# Patient Record
Sex: Female | Born: 1945 | Race: White | Hispanic: No | Marital: Single | Smoking: Former smoker
Health system: Southern US, Community
[De-identification: ages and names within clinical notes are randomized; demographics above are authoritative.]

## PROBLEM LIST (undated history)

## (undated) DIAGNOSIS — K5730 Diverticulosis of large intestine without perforation or abscess without bleeding: Secondary | ICD-10-CM

## (undated) DIAGNOSIS — K219 Gastro-esophageal reflux disease without esophagitis: Secondary | ICD-10-CM

## (undated) DIAGNOSIS — G4733 Obstructive sleep apnea (adult) (pediatric): Secondary | ICD-10-CM

## (undated) DIAGNOSIS — Z87898 Personal history of other specified conditions: Secondary | ICD-10-CM

## (undated) DIAGNOSIS — E739 Lactose intolerance, unspecified: Secondary | ICD-10-CM

## (undated) DIAGNOSIS — I080 Rheumatic disorders of both mitral and aortic valves: Secondary | ICD-10-CM

## (undated) DIAGNOSIS — Z0000 Encounter for general adult medical examination without abnormal findings: Secondary | ICD-10-CM

## (undated) DIAGNOSIS — N390 Urinary tract infection, site not specified: Secondary | ICD-10-CM

## (undated) DIAGNOSIS — F329 Major depressive disorder, single episode, unspecified: Secondary | ICD-10-CM

## (undated) DIAGNOSIS — M949 Disorder of cartilage, unspecified: Secondary | ICD-10-CM

## (undated) DIAGNOSIS — M25579 Pain in unspecified ankle and joints of unspecified foot: Secondary | ICD-10-CM

## (undated) DIAGNOSIS — E785 Hyperlipidemia, unspecified: Secondary | ICD-10-CM

## (undated) DIAGNOSIS — M48061 Spinal stenosis, lumbar region without neurogenic claudication: Secondary | ICD-10-CM

## (undated) DIAGNOSIS — R5381 Other malaise: Secondary | ICD-10-CM

## (undated) DIAGNOSIS — R5383 Other fatigue: Secondary | ICD-10-CM

## (undated) DIAGNOSIS — M899 Disorder of bone, unspecified: Secondary | ICD-10-CM

## (undated) DIAGNOSIS — F411 Generalized anxiety disorder: Secondary | ICD-10-CM

## (undated) DIAGNOSIS — I10 Essential (primary) hypertension: Secondary | ICD-10-CM

## (undated) DIAGNOSIS — R7302 Impaired glucose tolerance (oral): Secondary | ICD-10-CM

## (undated) DIAGNOSIS — R21 Rash and other nonspecific skin eruption: Secondary | ICD-10-CM

## (undated) DIAGNOSIS — M549 Dorsalgia, unspecified: Secondary | ICD-10-CM

## (undated) HISTORY — DX: Disorder of cartilage, unspecified: M94.9

## (undated) HISTORY — DX: Other malaise: R53.81

## (undated) HISTORY — DX: Generalized anxiety disorder: F41.1

## (undated) HISTORY — PX: BREAST REDUCTION SURGERY: SHX8

## (undated) HISTORY — DX: Hyperlipidemia, unspecified: E78.5

## (undated) HISTORY — DX: Obstructive sleep apnea (adult) (pediatric): G47.33

## (undated) HISTORY — DX: Rheumatic disorders of both mitral and aortic valves: I08.0

## (undated) HISTORY — DX: Major depressive disorder, single episode, unspecified: F32.9

## (undated) HISTORY — DX: Pain in unspecified ankle and joints of unspecified foot: M25.579

## (undated) HISTORY — PX: TONSILLECTOMY AND ADENOIDECTOMY: SUR1326

## (undated) HISTORY — DX: Essential (primary) hypertension: I10

## (undated) HISTORY — DX: Other fatigue: R53.83

## (undated) HISTORY — DX: Disorder of bone, unspecified: M89.9

## (undated) HISTORY — DX: Urinary tract infection, site not specified: N39.0

## (undated) HISTORY — DX: Encounter for general adult medical examination without abnormal findings: Z00.00

## (undated) HISTORY — DX: Diverticulosis of large intestine without perforation or abscess without bleeding: K57.30

## (undated) HISTORY — DX: Rash and other nonspecific skin eruption: R21

## (undated) HISTORY — PX: OTHER SURGICAL HISTORY: SHX169

## (undated) HISTORY — DX: Personal history of other specified conditions: Z87.898

## (undated) HISTORY — DX: Morbid (severe) obesity due to excess calories: E66.01

## (undated) HISTORY — DX: Spinal stenosis, lumbar region without neurogenic claudication: M48.061

## (undated) HISTORY — DX: Impaired glucose tolerance (oral): R73.02

## (undated) HISTORY — PX: LIPOSUCTION: SHX10

## (undated) HISTORY — DX: Lactose intolerance, unspecified: E73.9

## (undated) HISTORY — DX: Dorsalgia, unspecified: M54.9

## (undated) HISTORY — DX: Gastro-esophageal reflux disease without esophagitis: K21.9

## (undated) HISTORY — PX: ABDOMINOPLASTY: SUR9

---

## 2002-05-05 LAB — HM COLONOSCOPY

## 2003-05-22 ENCOUNTER — Other Ambulatory Visit: Admission: RE | Admit: 2003-05-22 | Discharge: 2003-05-22 | Payer: Self-pay | Admitting: Internal Medicine

## 2004-08-29 ENCOUNTER — Ambulatory Visit: Payer: Self-pay | Admitting: Internal Medicine

## 2005-01-21 ENCOUNTER — Ambulatory Visit: Payer: Self-pay | Admitting: Internal Medicine

## 2005-01-22 ENCOUNTER — Ambulatory Visit: Payer: Self-pay | Admitting: Internal Medicine

## 2005-08-21 ENCOUNTER — Ambulatory Visit: Payer: Self-pay | Admitting: Pulmonary Disease

## 2005-09-10 ENCOUNTER — Ambulatory Visit: Payer: Self-pay | Admitting: Internal Medicine

## 2005-09-29 ENCOUNTER — Ambulatory Visit: Payer: Self-pay | Admitting: Internal Medicine

## 2005-10-06 ENCOUNTER — Ambulatory Visit: Payer: Self-pay | Admitting: Internal Medicine

## 2006-03-05 ENCOUNTER — Ambulatory Visit: Payer: Self-pay | Admitting: Internal Medicine

## 2006-03-10 ENCOUNTER — Ambulatory Visit: Payer: Self-pay | Admitting: Internal Medicine

## 2006-05-29 ENCOUNTER — Ambulatory Visit: Payer: Self-pay | Admitting: Internal Medicine

## 2007-03-29 ENCOUNTER — Ambulatory Visit: Payer: Self-pay | Admitting: Internal Medicine

## 2007-03-29 LAB — CONVERTED CEMR LAB
ALT: 82 units/L — ABNORMAL HIGH (ref 0–35)
AST: 62 units/L — ABNORMAL HIGH (ref 0–37)
Albumin: 4.3 g/dL (ref 3.5–5.2)
Alkaline Phosphatase: 60 units/L (ref 39–117)
Basophils Relative: 0.7 % (ref 0.0–1.0)
Bilirubin Urine: NEGATIVE
Bilirubin, Direct: 0.1 mg/dL (ref 0.0–0.3)
Calcium: 9.6 mg/dL (ref 8.4–10.5)
Creatinine, Ser: 0.7 mg/dL (ref 0.4–1.2)
Crystals: NEGATIVE
Eosinophils Absolute: 0.2 10*3/uL (ref 0.0–0.6)
GFR calc Af Amer: 109 mL/min
Glucose, Bld: 111 mg/dL — ABNORMAL HIGH (ref 70–99)
HDL: 54.1 mg/dL (ref >39.0)
Lymphocytes Relative: 47.4 % — ABNORMAL HIGH (ref 12.0–46.0)
Neutro Abs: 2.9 10*3/uL (ref 1.4–7.7)
Neutrophils Relative %: 40.8 % — ABNORMAL LOW (ref 43.0–77.0)
Nitrite: NEGATIVE
Platelets: 251 10*3/uL (ref 150–400)
Potassium: 3.7 meq/L (ref 3.5–5.1)
Total CHOL/HDL Ratio: 5
Total Protein, Urine: NEGATIVE mg/dL
Total Protein: 7.7 g/dL (ref 6.0–8.3)
Urine Glucose: NEGATIVE mg/dL
VLDL: 43 mg/dL — ABNORMAL HIGH (ref 0–40)
pH: 6 (ref 5.0–8.0)

## 2007-03-31 ENCOUNTER — Ambulatory Visit: Payer: Self-pay | Admitting: Internal Medicine

## 2007-03-31 LAB — CONVERTED CEMR LAB
Hep B C IgM: NEGATIVE
Hepatitis B Surface Ag: NEGATIVE

## 2007-04-06 ENCOUNTER — Encounter: Admission: RE | Admit: 2007-04-06 | Discharge: 2007-04-06 | Payer: Self-pay | Admitting: Internal Medicine

## 2007-04-07 ENCOUNTER — Ambulatory Visit: Payer: Self-pay | Admitting: Pulmonary Disease

## 2007-04-14 ENCOUNTER — Ambulatory Visit: Payer: Self-pay | Admitting: Internal Medicine

## 2007-04-14 ENCOUNTER — Encounter: Payer: Self-pay | Admitting: Internal Medicine

## 2007-04-14 DIAGNOSIS — I080 Rheumatic disorders of both mitral and aortic valves: Secondary | ICD-10-CM

## 2007-04-14 DIAGNOSIS — F329 Major depressive disorder, single episode, unspecified: Secondary | ICD-10-CM

## 2007-04-14 DIAGNOSIS — I10 Essential (primary) hypertension: Secondary | ICD-10-CM

## 2007-04-14 DIAGNOSIS — E785 Hyperlipidemia, unspecified: Secondary | ICD-10-CM | POA: Insufficient documentation

## 2007-04-14 DIAGNOSIS — M25579 Pain in unspecified ankle and joints of unspecified foot: Secondary | ICD-10-CM | POA: Insufficient documentation

## 2007-04-14 DIAGNOSIS — Z87898 Personal history of other specified conditions: Secondary | ICD-10-CM

## 2007-04-14 DIAGNOSIS — F411 Generalized anxiety disorder: Secondary | ICD-10-CM

## 2007-04-14 DIAGNOSIS — F3289 Other specified depressive episodes: Secondary | ICD-10-CM

## 2007-04-14 HISTORY — DX: Major depressive disorder, single episode, unspecified: F32.9

## 2007-04-14 HISTORY — DX: Pain in unspecified ankle and joints of unspecified foot: M25.579

## 2007-04-14 HISTORY — DX: Other specified depressive episodes: F32.89

## 2007-04-14 HISTORY — DX: Morbid (severe) obesity due to excess calories: E66.01

## 2007-04-14 HISTORY — DX: Generalized anxiety disorder: F41.1

## 2007-04-14 HISTORY — DX: Essential (primary) hypertension: I10

## 2007-04-14 HISTORY — DX: Rheumatic disorders of both mitral and aortic valves: I08.0

## 2007-04-14 HISTORY — DX: Hyperlipidemia, unspecified: E78.5

## 2007-04-14 HISTORY — DX: Personal history of other specified conditions: Z87.898

## 2007-04-15 IMAGING — CT CT PARANASAL SINUSES LIMITED
1 of 2 series · 16 of 22 positions shown, 20 images · non-contrast
Comparison: none

CLINICAL DATA: Refractory sinusitis.  Bilateral ear infections and sinus infection for six weeks with pressure around eyes, sore throat, and cough.
 LIMITED CT OF PARANASAL SINUSES:
TECHNIQUE: Limited coronal CT images were obtained through the paranasal sinuses without intravenous contrast.

[Series 4: ltd sinus 3.0 h30s · axial · 0.29mm/px · z∈[-97,-13]mm · 16 of 21 slices shown, 20 images]
[im 2/21  brain]
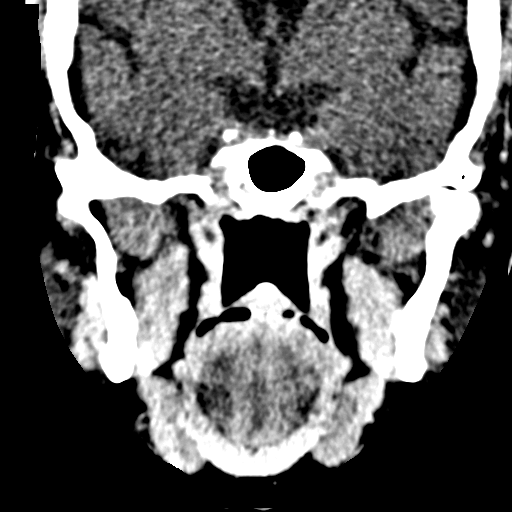
[im 2/21  bone]
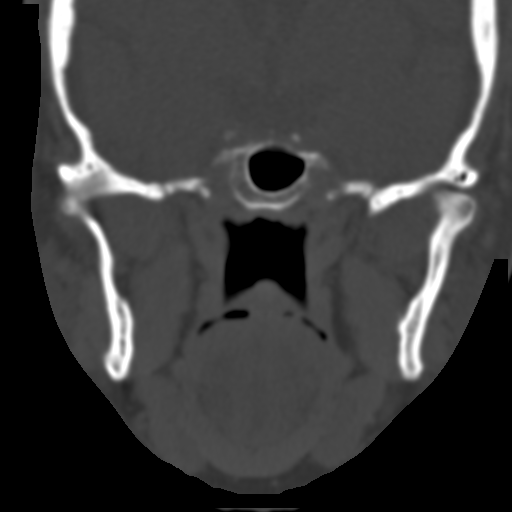
[im 3/21  bone]
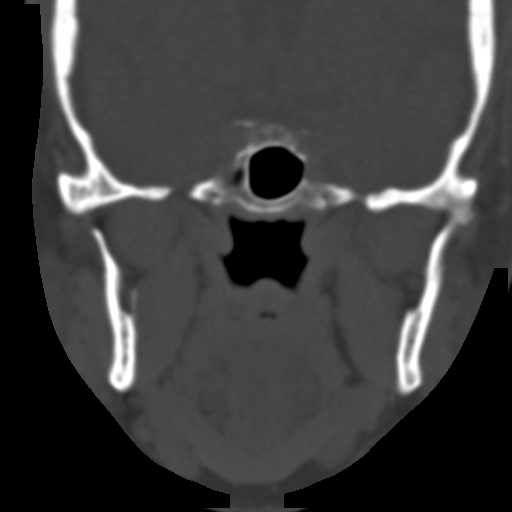
[im 4/21  bone]
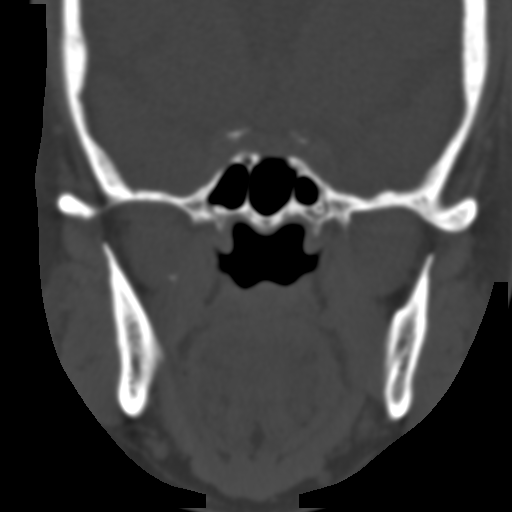
[im 6/21  bone]
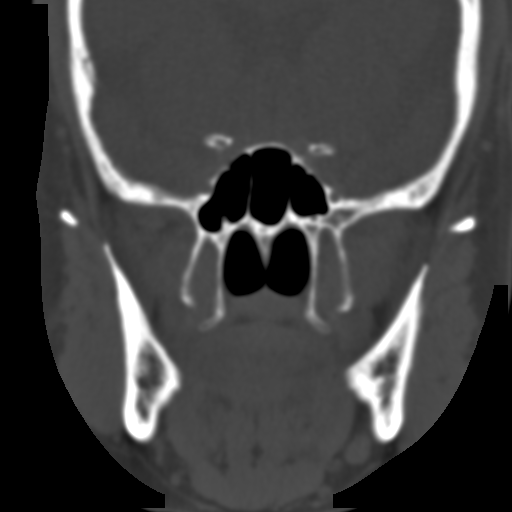
[im 7/21  brain]
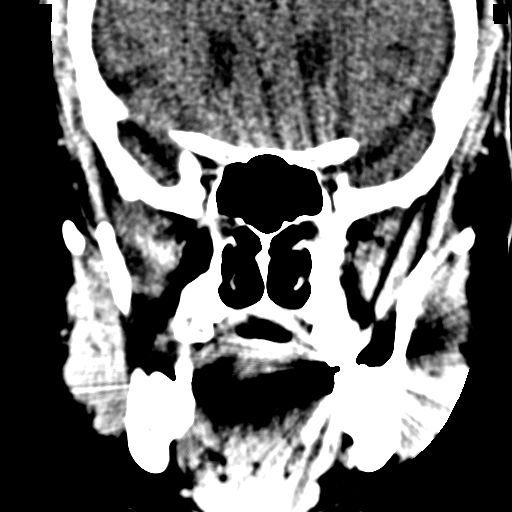
[im 7/21  bone]
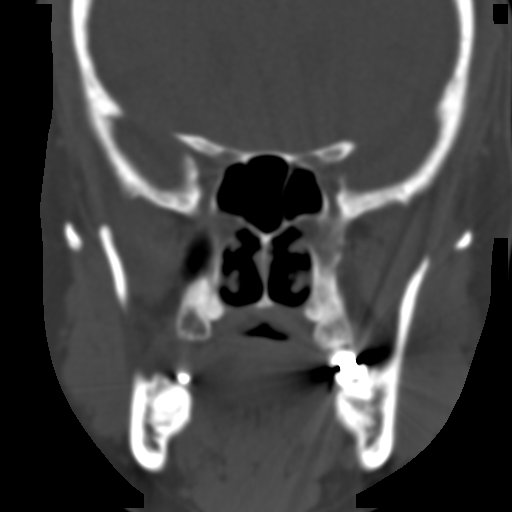
[im 8/21  bone]
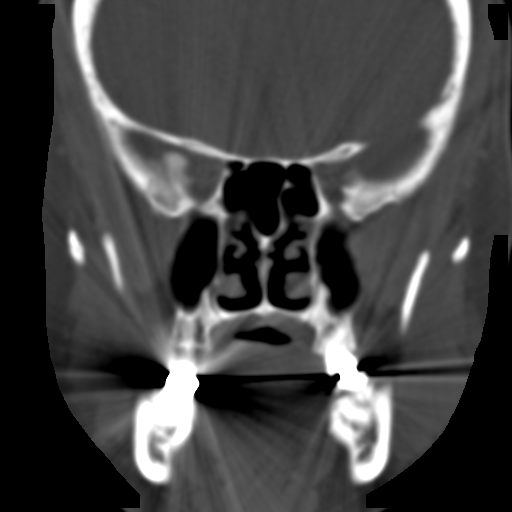
[im 9/21  bone]
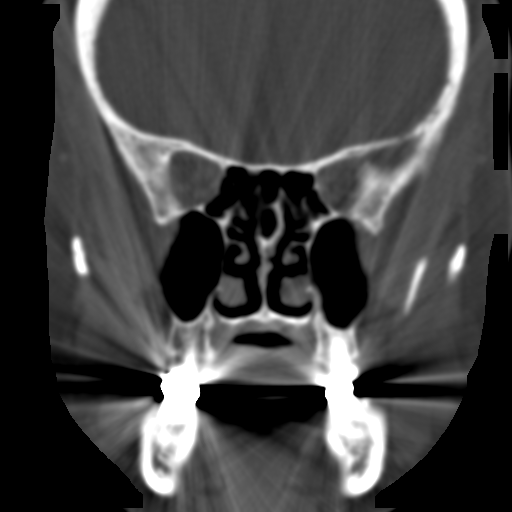
[im 10/21  bone]
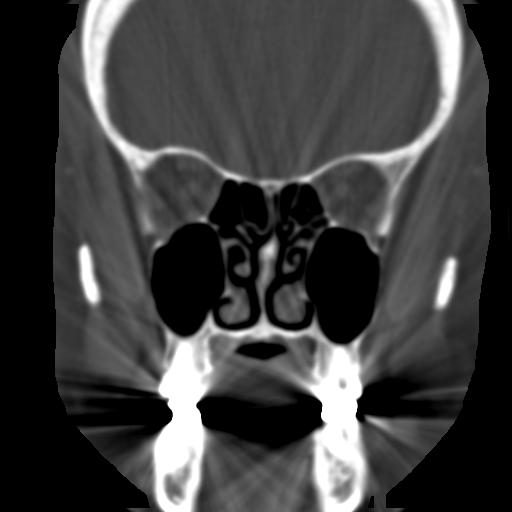
[im 12/21  brain]
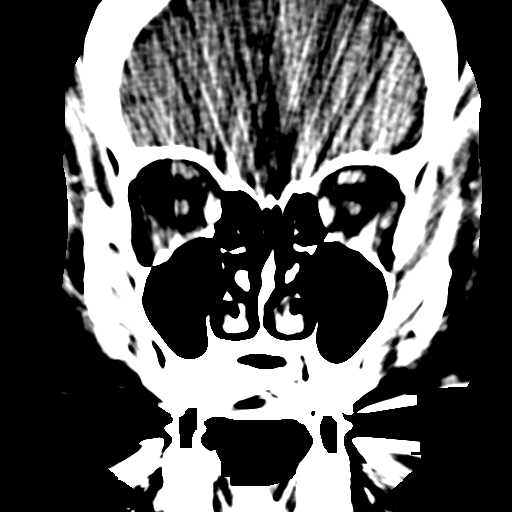
[im 12/21  bone]
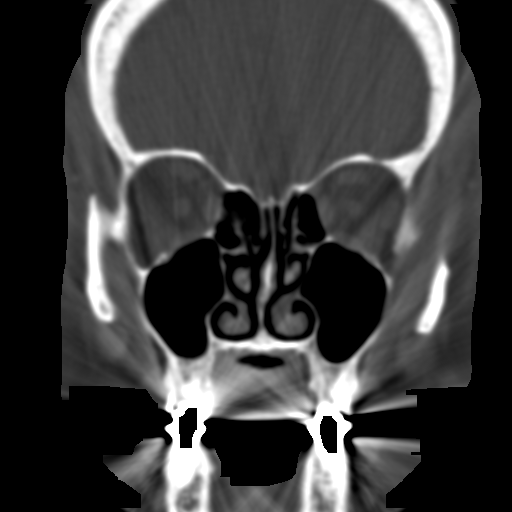
[im 13/21  bone]
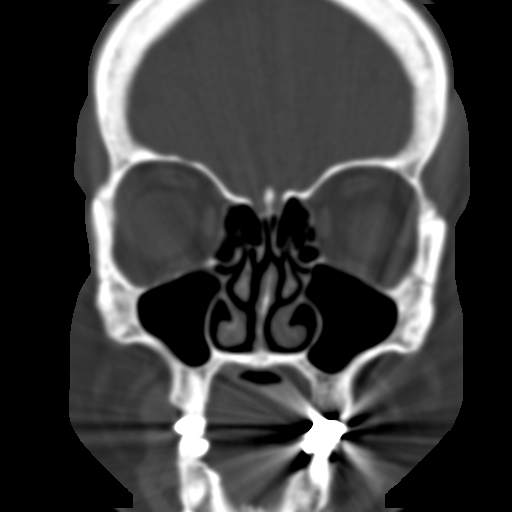
[im 14/21  bone]
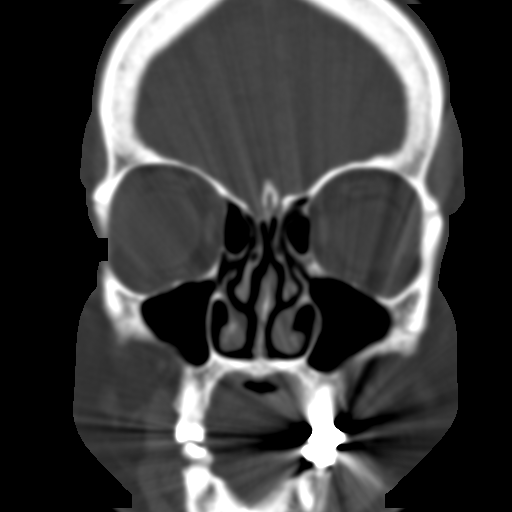
[im 15/21  bone]
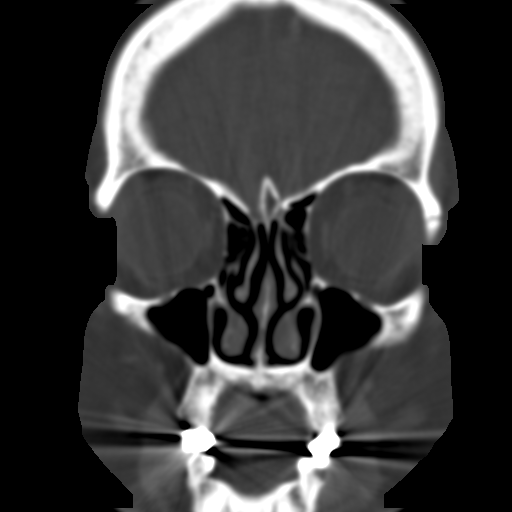
[im 16/21  brain]
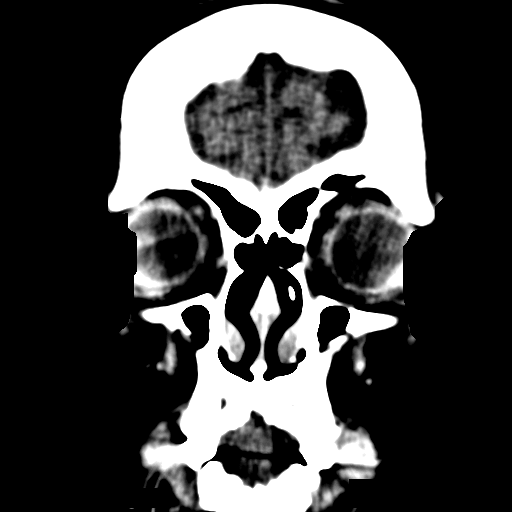
[im 16/21  bone]
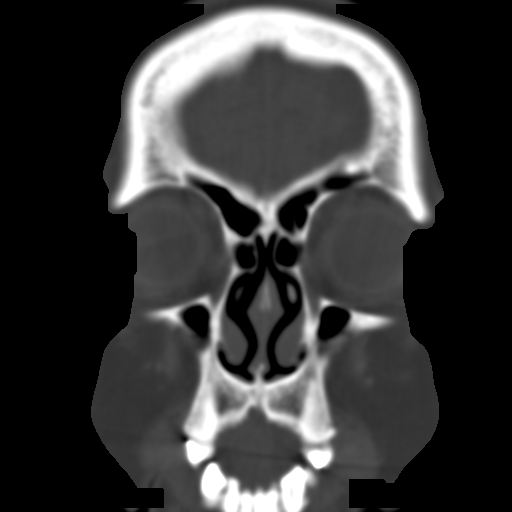
[im 18/21  bone]
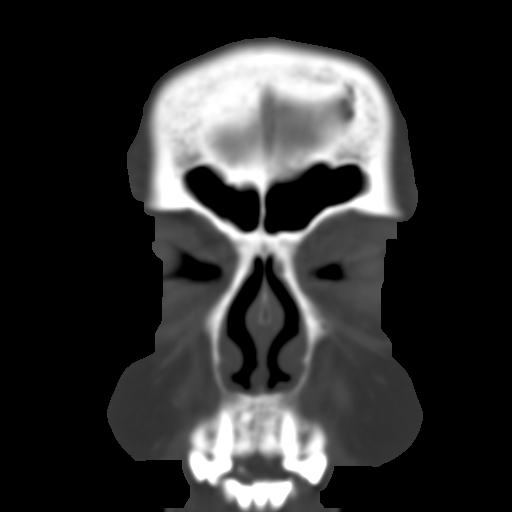
[im 19/21  bone]
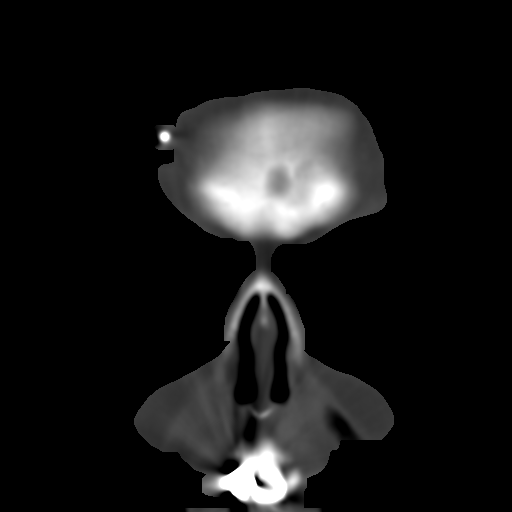
[im 20/21  bone]
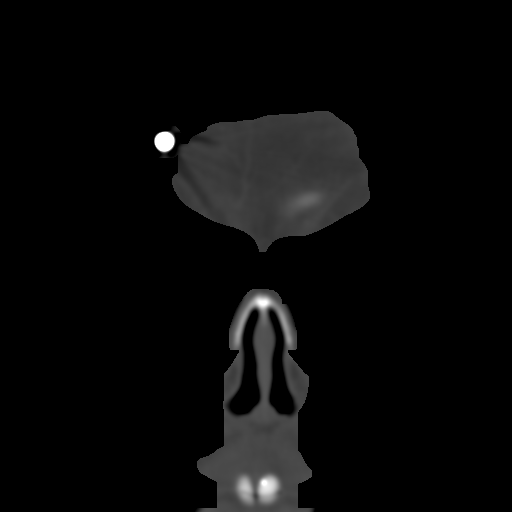

[16 of 22 positions shown; findings below may reference images not displayed]

FINDINGS: Well aerated frontal, ethmoid, sphenoid, and maxillary sinuses.  The ostiomeatal units appear patent.  No bony sinus wall abnormality.
IMPRESSION: Negative CT scan of the paranasal sinuses.

## 2007-04-21 ENCOUNTER — Ambulatory Visit (HOSPITAL_BASED_OUTPATIENT_CLINIC_OR_DEPARTMENT_OTHER): Admission: RE | Admit: 2007-04-21 | Discharge: 2007-04-21 | Payer: Self-pay | Admitting: Pulmonary Disease

## 2007-04-30 ENCOUNTER — Ambulatory Visit: Payer: Self-pay | Admitting: Pulmonary Disease

## 2007-05-31 ENCOUNTER — Ambulatory Visit: Payer: Self-pay | Admitting: Internal Medicine

## 2007-05-31 LAB — CONVERTED CEMR LAB
ALT: 72 units/L — ABNORMAL HIGH (ref 0–35)
Alkaline Phosphatase: 63 units/L (ref 39–117)
Bilirubin, Direct: 0.1 mg/dL (ref 0.0–0.3)
Triglycerides: 200 mg/dL — ABNORMAL HIGH (ref 0–149)
VLDL: 40 mg/dL (ref 0–40)

## 2007-11-09 ENCOUNTER — Ambulatory Visit: Payer: Self-pay | Admitting: Internal Medicine

## 2007-11-09 DIAGNOSIS — R21 Rash and other nonspecific skin eruption: Secondary | ICD-10-CM

## 2007-11-09 DIAGNOSIS — K219 Gastro-esophageal reflux disease without esophagitis: Secondary | ICD-10-CM

## 2007-11-09 DIAGNOSIS — G4733 Obstructive sleep apnea (adult) (pediatric): Secondary | ICD-10-CM

## 2007-11-09 DIAGNOSIS — M549 Dorsalgia, unspecified: Secondary | ICD-10-CM | POA: Insufficient documentation

## 2007-11-09 HISTORY — DX: Obstructive sleep apnea (adult) (pediatric): G47.33

## 2007-11-09 HISTORY — DX: Rash and other nonspecific skin eruption: R21

## 2007-11-09 HISTORY — DX: Gastro-esophageal reflux disease without esophagitis: K21.9

## 2007-11-09 HISTORY — DX: Dorsalgia, unspecified: M54.9

## 2008-01-05 ENCOUNTER — Telehealth: Payer: Self-pay | Admitting: Internal Medicine

## 2008-01-31 ENCOUNTER — Telehealth: Payer: Self-pay | Admitting: Internal Medicine

## 2008-03-13 ENCOUNTER — Telehealth (INDEPENDENT_AMBULATORY_CARE_PROVIDER_SITE_OTHER): Payer: Self-pay | Admitting: *Deleted

## 2008-03-20 ENCOUNTER — Ambulatory Visit: Payer: Self-pay | Admitting: Internal Medicine

## 2008-03-20 LAB — CONVERTED CEMR LAB
AST: 64 units/L — ABNORMAL HIGH (ref 0–37)
Albumin: 4.1 g/dL (ref 3.5–5.2)
Alkaline Phosphatase: 61 units/L (ref 39–117)
BUN: 17 mg/dL (ref 6–23)
Basophils Absolute: 0 10*3/uL (ref 0.0–0.1)
Bilirubin, Direct: 0.1 mg/dL (ref 0.0–0.3)
Calcium: 9.5 mg/dL (ref 8.4–10.5)
Cholesterol: 176 mg/dL (ref 0–200)
Eosinophils Absolute: 0.4 10*3/uL (ref 0.0–0.7)
GFR calc Af Amer: 109 mL/min
GFR calc non Af Amer: 90 mL/min
Hemoglobin, Urine: NEGATIVE
Hemoglobin: 13.4 g/dL (ref 12.0–15.0)
Ketones, ur: NEGATIVE mg/dL
LDL Cholesterol: 104 mg/dL — ABNORMAL HIGH (ref 0–99)
Lymphocytes Relative: 48.2 % — ABNORMAL HIGH (ref 12.0–46.0)
MCHC: 34 g/dL (ref 30.0–36.0)
MCV: 87.5 fL (ref 78.0–100.0)
Monocytes Absolute: 0.6 10*3/uL (ref 0.1–1.0)
Nitrite: NEGATIVE
Platelets: 230 10*3/uL (ref 150–400)
RBC: 4.49 M/uL (ref 3.87–5.11)
Sodium: 143 meq/L (ref 135–145)
TSH: 3.68 microintl units/mL (ref 0.35–5.50)
Total Bilirubin: 0.7 mg/dL (ref 0.3–1.2)
Total Protein: 7.5 g/dL (ref 6.0–8.3)
Triglycerides: 134 mg/dL (ref 0–149)
Urobilinogen, UA: 0.2 (ref 0.0–1.0)
VLDL: 27 mg/dL (ref 0–40)
WBC: 9.7 10*3/uL (ref 4.5–10.5)

## 2008-03-22 ENCOUNTER — Ambulatory Visit: Payer: Self-pay | Admitting: Internal Medicine

## 2008-03-22 DIAGNOSIS — K5730 Diverticulosis of large intestine without perforation or abscess without bleeding: Secondary | ICD-10-CM

## 2008-03-22 DIAGNOSIS — N390 Urinary tract infection, site not specified: Secondary | ICD-10-CM

## 2008-03-22 DIAGNOSIS — E739 Lactose intolerance, unspecified: Secondary | ICD-10-CM

## 2008-03-22 HISTORY — DX: Urinary tract infection, site not specified: N39.0

## 2008-03-22 HISTORY — DX: Diverticulosis of large intestine without perforation or abscess without bleeding: K57.30

## 2008-03-22 HISTORY — DX: Lactose intolerance, unspecified: E73.9

## 2008-04-03 ENCOUNTER — Telehealth: Payer: Self-pay | Admitting: Internal Medicine

## 2008-05-12 ENCOUNTER — Ambulatory Visit: Payer: Self-pay | Admitting: Internal Medicine

## 2008-10-11 ENCOUNTER — Telehealth (INDEPENDENT_AMBULATORY_CARE_PROVIDER_SITE_OTHER): Payer: Self-pay | Admitting: *Deleted

## 2008-10-13 IMAGING — US US ABDOMEN COMPLETE
1 series · 13 of 25 positions shown · non-contrast
Comparison: none

CLINICAL DATA: Elevated liver function tests.  
 COMPLETE ABDOMINAL ULTRASOUND:
TECHNIQUE: Complete abdominal ultrasound examination was performed including evaluation of the liver, gallbladder, bile ducts, pancreas, kidneys, spleen, IVC, and abdominal aorta.

[Series 1: us abdomen complete · 0.28mm/px · 13 of 66 slices shown]
[im 1/66]
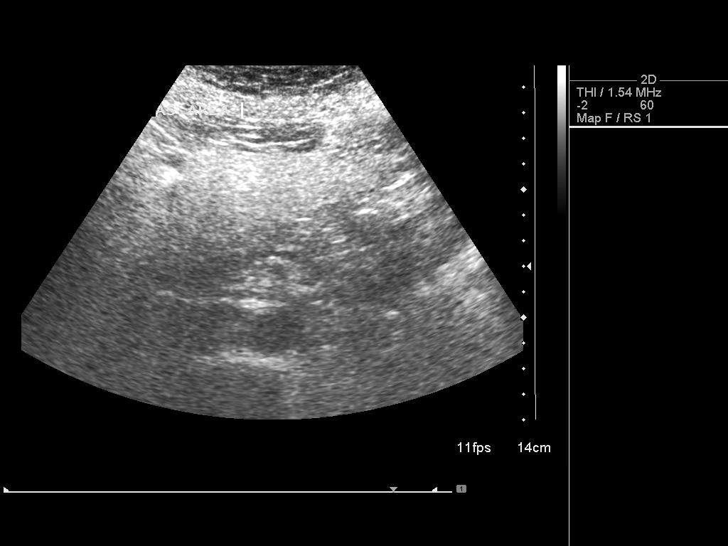
[im 6/66]
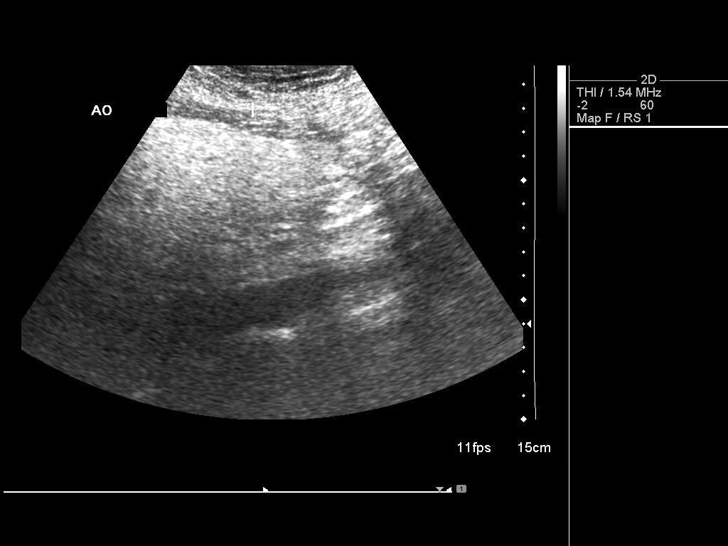
[im 11/66]
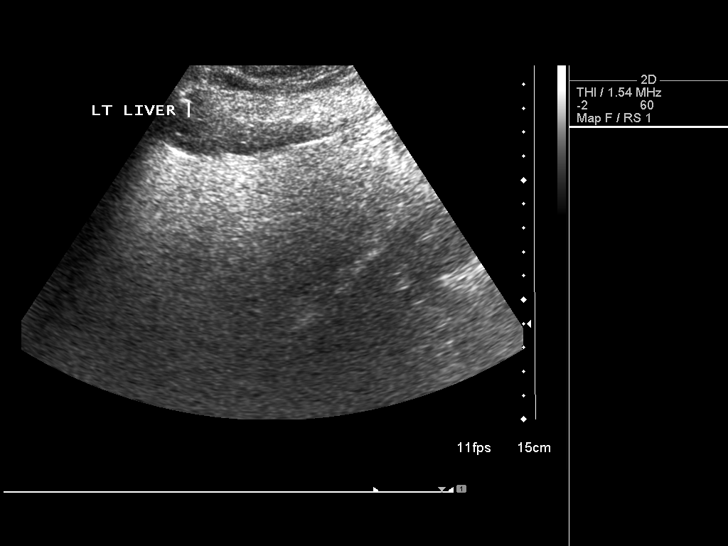
[im 17/66]
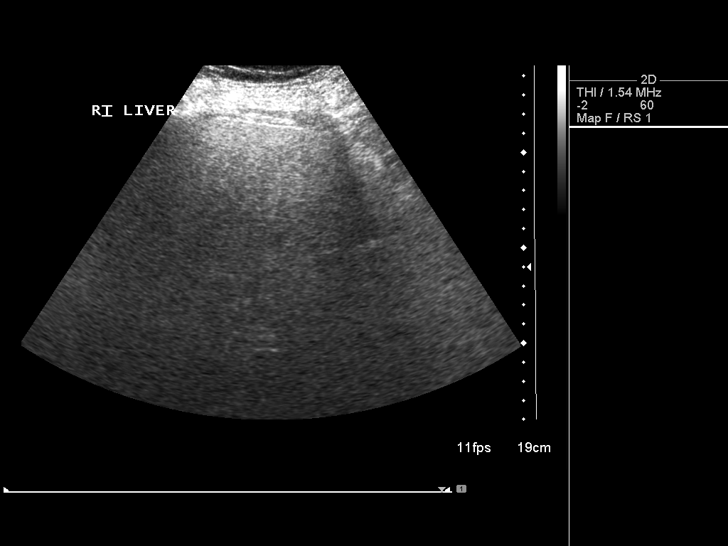
[im 22/66]
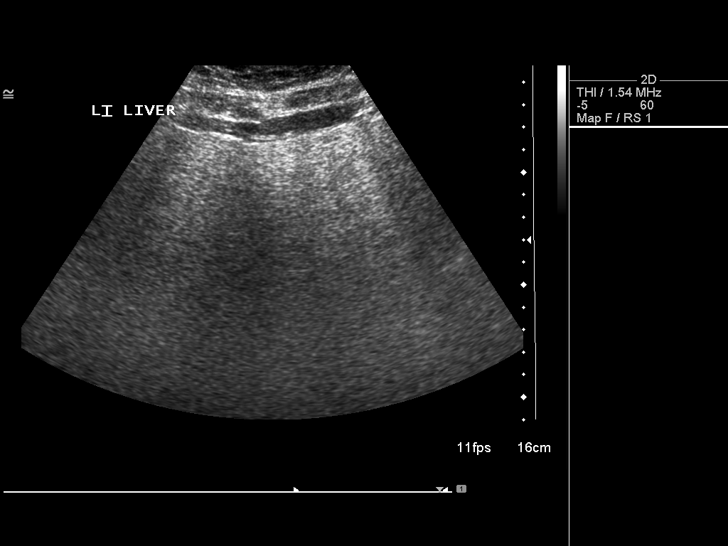
[im 28/66]
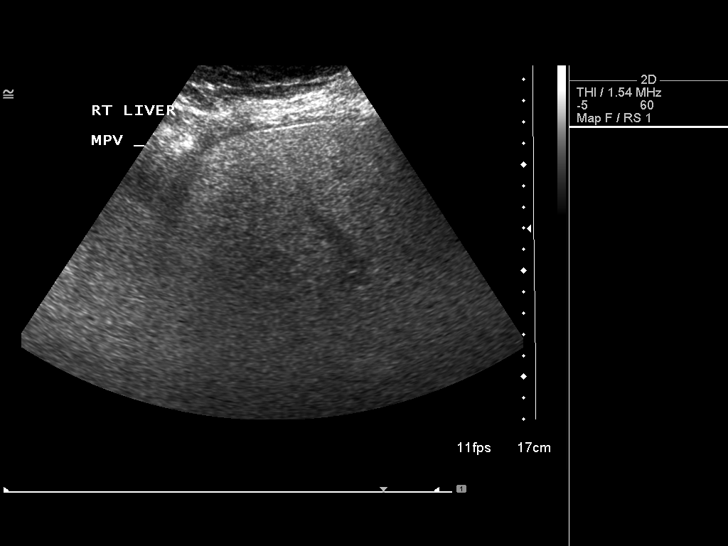
[im 33/66]
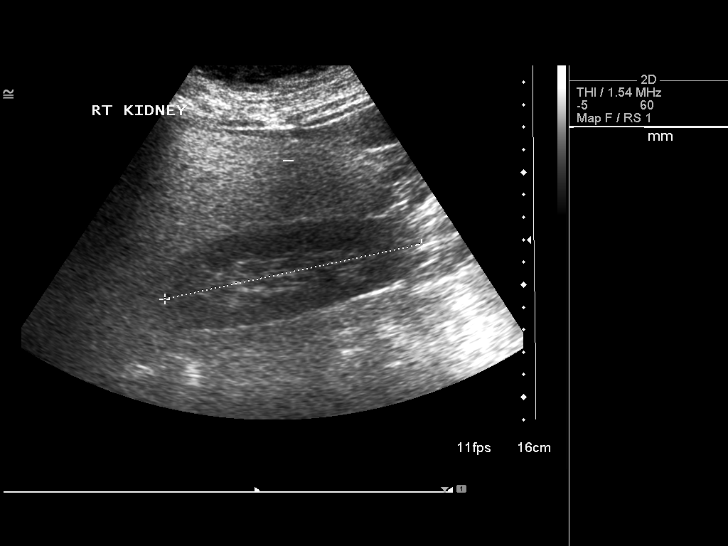
[im 38/66]
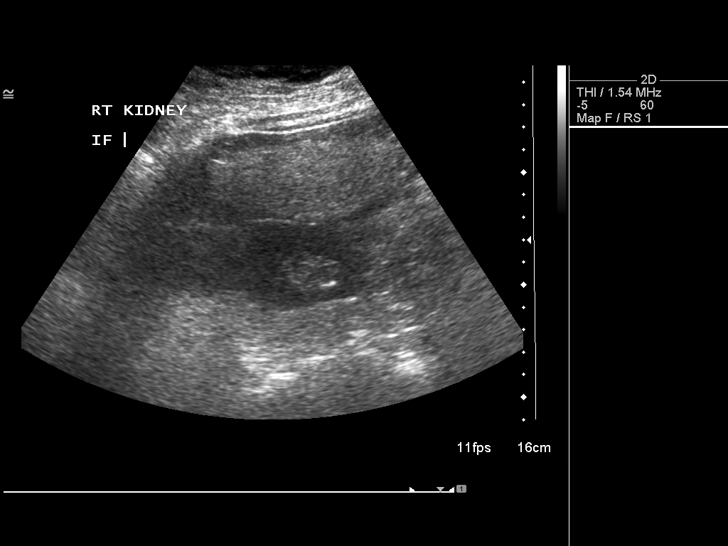
[im 44/66]
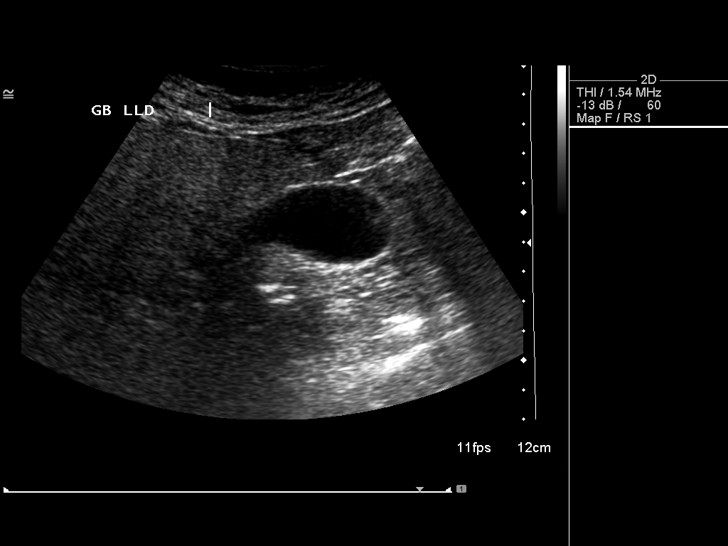
[im 49/66]
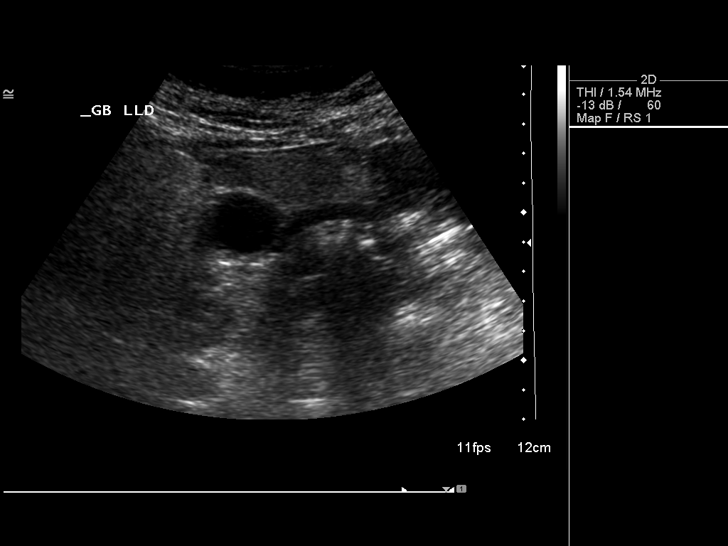
[im 55/66]
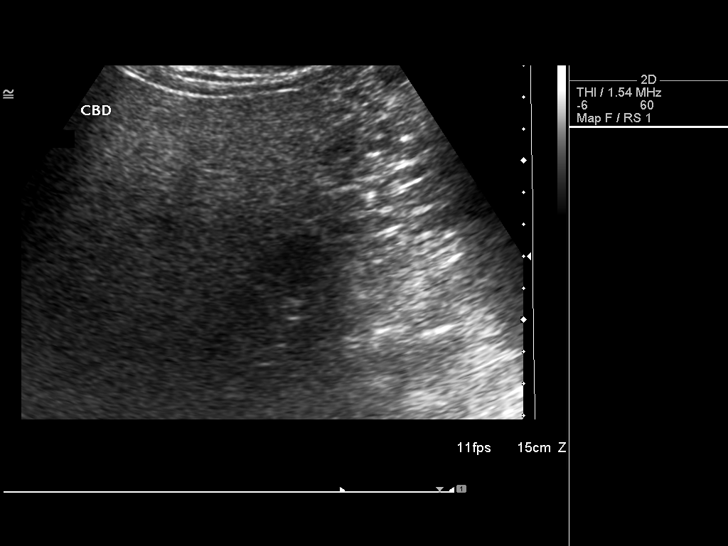
[im 60/66]
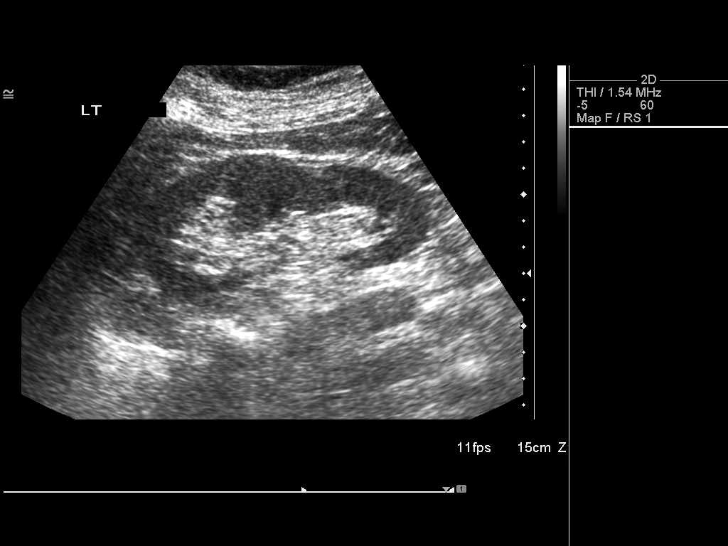
[im 66/66]
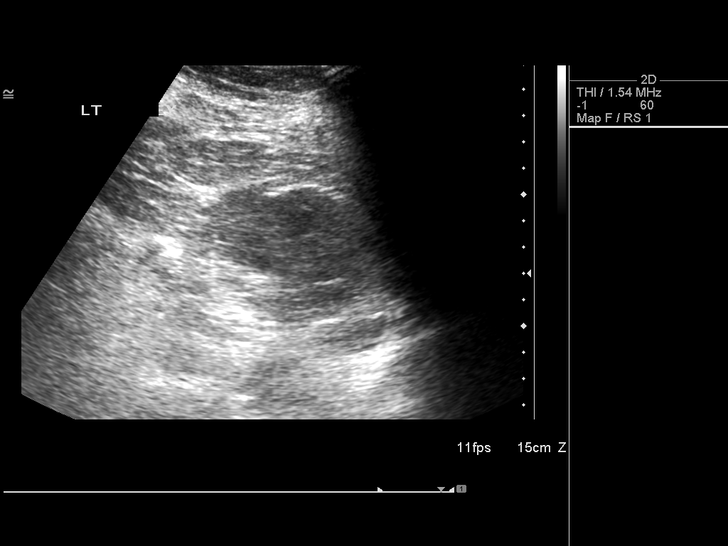

[13 of 25 positions shown; findings below may reference images not displayed]

FINDINGS: The gallbladder is well visualized and appears normal.  Wall thickness is normal.  Common bile duct is normal at 2.8mm in diameter.  The liver is slightly prominent and there is diffuse increased echogenicity consistent with fatty infiltration.  There is limited visualization of the pancreas and inferior vena cava but the visualized portions are normal.  The uncinate process and extreme tail of the pancreas are obscured by adjacent bowel.  The spleen is normal at 11.1cm in length.  Right kidney is 11.7cm in length.  Left kidney is 11.7cm in length.  No hydronephrosis or renal cortical thinning.  The maximum diameter of the visualized portion of the aorta is 2.2cm.  Some of the distal aorta is obscured by bowel gas.
IMPRESSION: 1.  No acute abnormality.  Fatty liver with slight enlargement of the liver to a length of 20.2cm.  
 2.  Slightly limited evaluation of the inferior vena cava and pancreas as described.  Normal gallbladder.

## 2008-11-08 ENCOUNTER — Telehealth (INDEPENDENT_AMBULATORY_CARE_PROVIDER_SITE_OTHER): Payer: Self-pay | Admitting: *Deleted

## 2009-03-13 ENCOUNTER — Ambulatory Visit: Payer: Self-pay | Admitting: Internal Medicine

## 2009-04-10 ENCOUNTER — Telehealth: Payer: Self-pay | Admitting: Internal Medicine

## 2009-04-13 ENCOUNTER — Telehealth: Payer: Self-pay | Admitting: Internal Medicine

## 2009-08-25 HISTORY — PX: OTHER SURGICAL HISTORY: SHX169

## 2009-10-23 ENCOUNTER — Telehealth: Payer: Self-pay | Admitting: Internal Medicine

## 2010-02-04 ENCOUNTER — Telehealth: Payer: Self-pay | Admitting: Internal Medicine

## 2010-03-13 ENCOUNTER — Ambulatory Visit: Payer: Self-pay | Admitting: Internal Medicine

## 2010-03-14 ENCOUNTER — Encounter: Payer: Self-pay | Admitting: Internal Medicine

## 2010-03-14 LAB — CONVERTED CEMR LAB
ALT: 38 units/L — ABNORMAL HIGH (ref 0–35)
AST: 51 units/L — ABNORMAL HIGH (ref 0–37)
Bilirubin Urine: NEGATIVE
Bilirubin, Direct: 0.1 mg/dL (ref 0.0–0.3)
Calcium: 9.6 mg/dL (ref 8.4–10.5)
Chloride: 103 meq/L (ref 96–112)
Direct LDL: 101 mg/dL
HDL: 44.1 mg/dL (ref >39.00)
Hemoglobin, Urine: NEGATIVE
Hemoglobin: 13.1 g/dL (ref 12.0–15.0)
Lymphocytes Relative: 45.2 % (ref 12.0–46.0)
Lymphs Abs: 3.5 10*3/uL (ref 0.7–4.0)
Monocytes Absolute: 0.5 10*3/uL (ref 0.1–1.0)
Monocytes Relative: 6.6 % (ref 3.0–12.0)
Neutro Abs: 3.5 10*3/uL (ref 1.4–7.7)
Neutrophils Relative %: 45.8 % (ref 43.0–77.0)
Potassium: 4.2 meq/L (ref 3.5–5.1)
RBC: 4.56 M/uL (ref 3.87–5.11)
RDW: 14.5 % (ref 11.5–14.6)
Sodium: 141 meq/L (ref 135–145)
Total Bilirubin: 0.4 mg/dL (ref 0.3–1.2)
Total CHOL/HDL Ratio: 4
Urine Glucose: NEGATIVE mg/dL
WBC: 7.7 10*3/uL (ref 4.5–10.5)

## 2010-03-15 LAB — CONVERTED CEMR LAB
HCV Ab: NEGATIVE
Hep B C IgM: NEGATIVE

## 2010-03-18 ENCOUNTER — Ambulatory Visit: Payer: Self-pay | Admitting: Internal Medicine

## 2010-03-18 DIAGNOSIS — R5381 Other malaise: Secondary | ICD-10-CM

## 2010-03-18 DIAGNOSIS — R5383 Other fatigue: Secondary | ICD-10-CM

## 2010-03-18 DIAGNOSIS — M899 Disorder of bone, unspecified: Secondary | ICD-10-CM | POA: Insufficient documentation

## 2010-03-18 DIAGNOSIS — M949 Disorder of cartilage, unspecified: Secondary | ICD-10-CM

## 2010-03-18 DIAGNOSIS — M48061 Spinal stenosis, lumbar region without neurogenic claudication: Secondary | ICD-10-CM

## 2010-03-18 HISTORY — DX: Other malaise: R53.81

## 2010-03-18 HISTORY — DX: Spinal stenosis, lumbar region without neurogenic claudication: M48.061

## 2010-03-18 HISTORY — DX: Disorder of bone, unspecified: M89.9

## 2010-05-06 ENCOUNTER — Telehealth: Payer: Self-pay | Admitting: Internal Medicine

## 2010-07-03 ENCOUNTER — Telehealth: Payer: Self-pay | Admitting: Internal Medicine

## 2010-07-05 ENCOUNTER — Telehealth: Payer: Self-pay | Admitting: Internal Medicine

## 2010-07-29 ENCOUNTER — Telehealth (INDEPENDENT_AMBULATORY_CARE_PROVIDER_SITE_OTHER): Payer: Self-pay | Admitting: *Deleted

## 2010-09-22 LAB — CONVERTED CEMR LAB
ALT: 101 units/L — ABNORMAL HIGH (ref 0–35)
AST: 86 units/L — ABNORMAL HIGH (ref 0–37)
Albumin: 4.2 g/dL (ref 3.5–5.2)
Alkaline Phosphatase: 65 units/L (ref 39–117)
CO2: 28 meq/L (ref 19–32)
Cholesterol: 177 mg/dL (ref 0–200)
Glucose, Bld: 112 mg/dL — ABNORMAL HIGH (ref 70–99)
HCT: 40.9 % (ref 36.0–46.0)
HDL: 49.4 mg/dL (ref >39.00)
Hemoglobin: 13.7 g/dL (ref 12.0–15.0)
Hgb A1c MFr Bld: 5.7 % (ref 4.6–6.5)
Lymphs Abs: 3.2 10*3/uL (ref 0.7–4.0)
MCV: 90.5 fL (ref 78.0–100.0)
Platelets: 272 10*3/uL (ref 150.0–400.0)
RDW: 14.8 % — ABNORMAL HIGH (ref 11.5–14.6)
TSH: 3.4 microintl units/mL (ref 0.35–5.50)
Total Bilirubin: 0.9 mg/dL (ref 0.3–1.2)
Total CHOL/HDL Ratio: 4
Total Protein: 7.5 g/dL (ref 6.0–8.3)
Triglycerides: 220 mg/dL — ABNORMAL HIGH (ref 0.0–149.0)
Urobilinogen, UA: 0.2 (ref 0.0–1.0)
VLDL: 44 mg/dL — ABNORMAL HIGH (ref 0.0–40.0)
pH: 5.5 (ref 5.0–8.0)

## 2010-09-24 NOTE — Progress Notes (Signed)
Summary: ALT med  Phone Note From Pharmacy   Caller: [facility] [phone] ref [phone] Call For: Dr Jonny Ruiz  Summary of Call: Carfergot tab and generic are unavailable per manufacturer. Suppositories would be the only equivalent alternative but it has been discontinued. Pharmacy is requesting medication substitute, please advise Initial call taken by: Alysia Penna,  July 05, 2010 9:57 AM  Follow-up for Phone Call        pt will need OV  Follow-up by: Corwin Levins MD,  July 05, 2010 11:35 AM  Additional Follow-up for Phone Call Additional follow up Details #1::        called and left vm.  Additional Follow-up by: Alysia Penna,  July 05, 2010 1:48 PM    Additional Follow-up for Phone Call Additional follow up Details #2::    Pt informed but states she is going out of town tonight and is unsure of when she will be back. Pt states she will call office once she returns to schedule appt to discuss substitutes Follow-up by: Margaret Pyle, CMA,  July 05, 2010 3:08 PM

## 2010-09-24 NOTE — Progress Notes (Signed)
  Phone Note Refill Request Message from:  Pharmacy on February 04, 2010 2:49 PM  Refills Requested: Medication #1:  NEXIUM 40 MG  CPDR 1 by mouth once daily   Dosage confirmed as above?Dosage Confirmed   Last Refilled: 03/13/2009   Notes: [facility] Initial call taken by: Scharlene Gloss,  February 04, 2010 2:49 PM    Prescriptions: NEXIUM 40 MG  CPDR (ESOMEPRAZOLE MAGNESIUM) 1 by mouth once daily  #90 x 0   Entered by:   Scharlene Gloss   Authorized by:   Corwin Levins MD   Signed by:   Scharlene Gloss on 02/04/2010   Method used:   Faxed to ...       [facility] (mail-order)             , [place]         Ph:        Fax: [phone]   RxID:   [id number]

## 2010-09-24 NOTE — Progress Notes (Signed)
  Phone Note Refill Request  on October 23, 2009 4:13 PM  Refills Requested: Medication #1:  AZOR 10-40 MG  TABS 1 by mouth once daily   Dosage confirmed as above?Dosage Confirmed   Notes: [facility] [address] Initial call taken by: Scharlene Gloss,  October 23, 2009 4:13 PM    Prescriptions: AZOR 10-40 MG  TABS (AMLODIPINE-OLMESARTAN) 1 by mouth once daily  #30 x 0   Entered by:   Zella Ball Ewing   Authorized by:   Corwin Levins MD   Signed by:   Scharlene Gloss on 10/23/2009   Method used:   Faxed to ...       [facility]  [address] # [id number]* (retail)       [address]       [place], [place]  [address]       Ph: [phone] or [phone]       Fax: [phone]   RxID:   [id number]

## 2010-09-24 NOTE — Progress Notes (Signed)
Summary: medication substitution  Phone Note From Pharmacy   Caller: [facility] Summary of Call: Pharmacy sent fax to inform that Cafergot Tabs are temporarily unavailable from Mfg. the generic Ergotamine-caffeine is also unavailable. Please advise on a substitution at this time. Fax From [place], fax # [phone] Initial call taken by: Zella Ball Ewing CMA Duncan Dull),  July 03, 2010 8:29 AM  Follow-up for Phone Call        done hardcopy to [facility] side B - dahlia  Follow-up by: Corwin Levins MD,  July 03, 2010 4:55 PM  Additional Follow-up for Phone Call Additional follow up Details #1::        Rx faxed to [facility] Additional Follow-up by: Margaret Pyle, CMA,  July 04, 2010 8:05 AM    New/Updated Medications: CAFERGOT 1-100 MG TABS (ERGOTAMINE-CAFFEINE) 1po once daily as needed Prescriptions: CAFERGOT 1-100 MG TABS (ERGOTAMINE-CAFFEINE) 1po once daily as needed  #90 x 1   Entered and Authorized by:   Corwin Levins MD   Signed by:   Corwin Levins MD on 07/03/2010   Method used:   Print then Give to Patient   RxID:   [id number]

## 2010-09-24 NOTE — Progress Notes (Signed)
  Phone Note Other Incoming   Request: Send information Summary of Call: Request for records received from [place], Katrinka Blazing, [place], [employer] Request forwarded to [employer]. 03/24/08 to present

## 2010-09-24 NOTE — Assessment & Plan Note (Signed)
Summary: YEARLY---STC   Vital Signs:  Patient profile:   65 year old female Height:      62 inches Weight:      180.25 pounds BMI:     33.09 O2 Sat:      92 % on Room air Temp:     97.9 degrees F oral Pulse rate:   92 / minute BP sitting:   108 / 78  (left arm) Cuff size:   regular  Vitals Entered By: Zella Ball Ewing CMA (AAMA) (March 18, 2010 2:02 PM)  O2 Flow:  Room air  CC: Yearly/RE   CC:  Yearly/RE.  History of Present Illness: here to f/u;  Pt denies CP, sob, doe, wheezing, orthopnea, pnd, worsening LE edema, palps, dizziness or syncope  Pt denies new neuro symptoms such as headache, facial or extremity weakness  Pt denies polydipsia, polyuria, or low sugar symptoms such as shakiness improved with eating.  Overall good compliance with meds, trying to follow low chol, DM diet, wt stable, little excercise however Trying to follow lower chol /low fat diet but could do better she thinks and try to lose wt.  Has been less active as she also had cortisone shot per ortho to the RLE , still has some occasinally right sciatic, did have PT, still some diffictul to drive, so saw ortho again lsat wk, has MRI for next wk now (per dr Ranell Patrick);  also saw another ortho s/p bilat shoulder cortisone,  but has bilat rot cuff surg in april and may 2011 - still doing PT and still with pain and decrased stamina/strength; taking oxycodone primarily for that (Dr Adin Hector - [facility], [place]).  Also recent left ingrown nail removed per podiatry in [place]. also wants to know if she needs to take folic acid - I said Ok to take, since her diet/appetitie lower since the recent surguruies.  Also menitons an episode of fungal infection last summer - treated per MD near [employer] Overall good med compliance and good tolerance including the alendronate  Here for wellness Diet: Heart Healthy or DM if diabetic Physical Activities: Sedentary Depression/mood screen: Negative Hearing: Intact bilateral Visual Acuity:  Grossly normal, gets exam yearly ADL's: Capable  Fall Risk: None Home Safety: Good Cognitive Impairment:  Gen appearance, affect, speech, memory, attention & motor skills grossly intact End-of-Life Planning: Advance directive - Full code/I agree    Preventive Screening-Counseling & Management      Drug Use:  no.    Problems Prior to Update: 1)  Preventive Health Care  (ICD-V70.0) 2)  Uti  (ICD-599.0) 3)  Glucose Intolerance  (ICD-271.3) 4)  Preventive Health Care  (ICD-V70.0) 5)  Diverticulosis, Colon  (ICD-562.10) 6)  Rash-nonvesicular  (ICD-782.1) 7)  Back Pain  (ICD-724.5) 8)  Gerd  (ICD-530.81) 9)  Obstructive Sleep Apnea  (ICD-327.23) 10)  Pain in Joint, Ankle/foot  (ICD-719.47) 11)  Mitral Regurgitation  (ICD-396.3) 12)  Migraines, Hx of  (ICD-V13.8) 13)  Morbid Obesity  (ICD-278.01) 14)  Hypertension  (ICD-401.9) 15)  Hyperlipidemia  (ICD-272.4) 16)  Depression  (ICD-311) 17)  Anxiety  (ICD-300.00)  Medications Prior to Update: 1)  Folic Acid 1mg  .... 1 By Mouth Once Daily 2)  Nexium 40 Mg  Cpdr (Esomeprazole Magnesium) .Marland Kitchen.. 1 By Mouth Once Daily 3)  Crestor 20 Mg  Tabs (Rosuvastatin Calcium) .Marland Kitchen.. 1 By Mouth Once Daily 4)  Fosamax 70 Mg  Tabs (Alendronate Sodium) .Marland Kitchen.. 1 By Mouth Q Week 5)  Ambien 10 Mg  Tabs (Zolpidem  Tartrate) .Marland Kitchen.. 1 By Mouth At Bedtime As Needed 6)  Imitrex 50 Mg  Tabs (Sumatriptan Succinate) .Marland Kitchen.. 1 By Mouth Once Daily As Needed Migraine 7)  Diazepam 10 Mg Tabs (Diazepam) .... Take 1 Tablet By Mouth Twice A Day As Needed - To Fill Nov 12, 2009 8)  Fioricet 50-325-40 Mg Tabs (Butalbital-Apap-Caffeine) .Marland Kitchen.. 1 By Mouth Once Daily As Needed 9)  Azor 10-40 Mg  Tabs (Amlodipine-Olmesartan) .Marland Kitchen.. 1 By Mouth Once Daily 10)  Furosemide 40 Mg  Tabs (Furosemide) .Marland Kitchen.. 1 By Mouth Once Daily  Current Medications (verified): 1)  Folic Acid 1mg  .... 1 By Mouth Once Daily 2)  Nexium 40 Mg  Cpdr (Esomeprazole Magnesium) .Marland Kitchen.. 1 By Mouth Once Daily 3)  Crestor 20 Mg   Tabs (Rosuvastatin Calcium) .Marland Kitchen.. 1 By Mouth Once Daily 4)  Alendronate Sodium 70 Mg Tabs (Alendronate Sodium) .Marland Kitchen.. 1po Once Daily 5)  Zolpidem Tartrate 10 Mg Tabs (Zolpidem Tartrate) .Marland Kitchen.. 1 By Mouth At Bedtime As Needed 6)  Imitrex 50 Mg  Tabs (Sumatriptan Succinate) .Marland Kitchen.. 1 By Mouth Once Daily As Needed Migraine 7)  Diazepam 10 Mg Tabs (Diazepam) .... Take 1 Tablet By Mouth Twice A Day As Needed - To Fill March 18, 2010 8)  Azor 10-40 Mg  Tabs (Amlodipine-Olmesartan) .Marland Kitchen.. 1 By Mouth Once Daily 9)  Furosemide 40 Mg  Tabs (Furosemide) .Marland Kitchen.. 1 By Mouth Once Daily 10)  Cafergot 1-100 Mg Tabs (Ergotamine-Caffeine) .Marland Kitchen.. 1po Once Daily As Needed 11)  Aspir-Low 81 Mg Tbec (Aspirin) .Marland Kitchen.. 1po Once Daily  Allergies (verified): 1)  ! * Belladonna  Past History:  Family History: Last updated: 11/09/2007 heart disease brother with MI  Social History: Last updated: 03/18/2010 Former Smoker Alcohol use-yes widow 1 child retired- Investment banker, corporate; now Psychologist, sport and exercise  Drug use-no  Risk Factors: Smoking Status: quit (11/09/2007)  Past Medical History: Anxiety Depression Hyperlipidemia Hypertension OSA right sciatica lumbar stenosis/DJD DJD knees and ankles migraine GERD Diverticulosis, colon glucose intolerance Osteopenia     ortho - shoulder - Dr Adin Hector     ortho - back  - Dr North/anesthesia     derm Claris Gower MD  Past Surgical History: T & A Breast reduction Face Lift Liposuction Abdominoplasty s/p bilat rotater cuff surgury 2011  Family History: Reviewed history from 11/09/2007 and no changes required. heart disease brother with MI  Social History: Reviewed history from 03/13/2009 and no changes required. 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         all otherwise negative per pt -  had ecg prior to surgury in feb 2011;  also has constipation recent with the oxycodone ; has ongoing fatigue since the surguries  Physical Exam  Ears:  R ear normal and L ear normal.     Impression & Recommendations:  Problem # 1:  Preventive Health Care (ICD-V70.0)  Overall doing well, age appropriate education and counseling updated and referral for appropriate preventive services done unless declined, immunizations up to date or declined, diet counseling done if overweight, urged to quit smoking if smokes , most recent labs reviewed and  current ordered if appropriate, ecg reviewed or declined (interpretation per ECG scanned in the EMR if done); information regarding Medicare Prevention requirements given if appropriate; speciality referrals updated as appropriate   Orders: First annual wellness visit with prevention plan  (E4540)  Problem # 2:  OSTEOPENIA (ICD-733.90)  Her updated medication list for this problem includes:    Alendronate Sodium 70 Mg Tabs (Alendronate sodium) .Marland Kitchen... 1po once daily due for f/u dxa - to order, Continue all previous medications as before this visit   Orders: T-Bone Densitometry [phone]) T-Parathyroid Hormone, Intact [phone]) Prescription Created Electronically [id number])  Problem # 3:  GLUCOSE INTOLERANCE (ICD-271.3) asymtp - ok to chekc labs, work on wt loss and diet, Pt to cont DM diet, excercise, wt loss efforts; to check labs today  Orders: TLB-A1C / Hgb A1C (Glycohemoglobin) (83036-A1C)  Problem # 4:  HYPERTENSION (ICD-401.9)  Her updated medication list for this problem  includes:    Azor 10-40 Mg Tabs (Amlodipine-olmesartan) .Marland Kitchen... 1 by mouth once daily    Furosemide 40 Mg Tabs (Furosemide) .Marland Kitchen... 1 by mouth once daily  BP today: 108/78 Prior BP: 132/84 (03/13/2009)  Labs Reviewed: K+: 4.2 (03/13/2010) Creat: : 0.7 (03/13/2010)   Chol: 175 (03/13/2010)   HDL: 44.10 (03/13/2010)   LDL: 104 (03/20/2008)   TG: 300.0 (03/13/2010) stable overall by hx and exam, ok to continue meds/tx as is   Problem # 5:  HYPERLIPIDEMIA (ICD-272.4)  Her updated medication list for this problem includes:    Crestor 20 Mg Tabs (Rosuvastatin calcium) .Marland Kitchen... 1 by mouth once daily stable overall by hx and exam, ok to continue meds/tx as is   Labs Reviewed: SGOT: 51 (03/13/2010)   SGPT: 38 (03/13/2010)   HDL:44.10 (03/13/2010), 49.40 (03/13/2009)  LDL:104 (03/20/2008), 64 (78/46/9629)  Chol:175 (03/13/2010), 177 (03/13/2009)  Trig:300.0 (03/13/2010), 220.0 (03/13/2009)  Orders: TLB-Lipid Panel (80061-LIPID)  Problem # 6:  FATIGUE (ICD-780.79)  exam benign, to check labs below; follow with expectant management   Orders: TLB-BMP (Basic Metabolic Panel-BMET) (80048-METABOL) TLB-CBC Platelet - w/Differential (85025-CBCD) TLB-Hepatic/Liver Function Pnl (80076-HEPATIC) TLB-TSH (Thyroid Stimulating Hormone) (84443-TSH) TLB-Sedimentation Rate (ESR) (85652-ESR) TLB-IBC Pnl (Iron/FE;Transferrin) (83550-IBC) TLB-B12 + Folate Pnl (52841_32440-N02/VOZ) TLB-Udip ONLY (81003-UDIP) T-Vitamin D (25-Hydroxy) (36644-03474)  Complete Medication List: 1)  Folic Acid 1mg   .... 1 by mouth once daily 2)  Nexium 40 Mg Cpdr (Esomeprazole magnesium) .Marland Kitchen.. 1 by mouth once daily 3)  Crestor 20 Mg Tabs (Rosuvastatin calcium) .Marland Kitchen.. 1 by mouth once daily 4)  Alendronate Sodium 70 Mg Tabs (Alendronate sodium) .Marland Kitchen.. 1po once daily 5)  Zolpidem Tartrate 10 Mg Tabs (Zolpidem tartrate) .Marland Kitchen.. 1 by mouth at bedtime as needed 6)  Imitrex 50 Mg Tabs (Sumatriptan succinate) .Marland Kitchen.. 1 by mouth once daily as needed  migraine 7)  Diazepam 10 Mg Tabs (Diazepam) .... Take 1 tablet by mouth twice a day as needed - to fill March 18, 2010 8)  Azor 10-40 Mg Tabs (Amlodipine-olmesartan) .Marland Kitchen.. 1 by mouth once daily 9)  Furosemide 40 Mg Tabs (Furosemide) .Marland Kitchen.. 1 by mouth once daily 10)  Cafergot 1-100 Mg Tabs (Ergotamine-caffeine) .Marland Kitchen.. 1po once daily as needed 11)  Aspir-low 81 Mg Tbec (Aspirin) .Marland Kitchen.. 1po once daily 12)  Oxycodone Hcl 10 Mg Tabs (Oxycodone hcl) .... Per ortho  Patient Instructions: 1)  Continue all previous medications as before this visit  2)  please schedule your bone density test before leaving today 3)  Please go to the Lab in the basement for your blood and/or urine tests today 4)  please try the OTC miralax, or dulcolox for constipation 5)  Please schedule a follow-up appointment in 1 year or sooner if needed Prescriptions: IMITREX 50 MG  TABS (SUMATRIPTAN SUCCINATE) 1 by mouth once daily as needed migraine  #27 x 3   Entered and Authorized by:   Corwin Levins MD   Signed by:   Corwin Levins MD on 03/18/2010   Method used:   Print then Give to Patient   RxID:   [id number] DIAZEPAM 10 MG TABS (DIAZEPAM) Take 1 tablet by mouth twice a day as needed - to fill March 18, 2010  #60 x 5   Entered and Authorized by:   Corwin Levins MD   Signed by:   Corwin Levins MD on 03/18/2010   Method used:   Print then Give to Patient   RxID:   [id number] FUROSEMIDE 40 MG  TABS (FUROSEMIDE) 1 by mouth once daily  #90 x 3   Entered and Authorized by:   Corwin Levins MD   Signed by:   Corwin Levins MD on 03/18/2010   Method used:   Print then Give to Patient   RxID:   [id number] ZOLPIDEM TARTRATE 10 MG TABS (ZOLPIDEM TARTRATE) 1 by mouth at bedtime as needed  #90 x 1   Entered and Authorized by:   Corwin Levins MD   Signed by:   Corwin Levins MD on 03/18/2010   Method used:   Print then Give to Patient   RxID:   [id number] ALENDRONATE SODIUM 70 MG TABS (ALENDRONATE SODIUM) 1po once daily   #12 x 3   Entered and Authorized by:   Corwin Levins MD   Signed by:   Corwin Levins MD on 03/18/2010   Method used:   Print then Give to Patient   RxID:   [id number] CAFERGOT 1-100 MG TABS (ERGOTAMINE-CAFFEINE) 1po once daily as needed  #90 x 1   Entered and Authorized by:   Corwin Levins MD   Signed by:   Corwin Levins MD on 03/18/2010   Method used:   Print then Give to Patient   RxID:   [id number] CRESTOR 20 MG  TABS (ROSUVASTATIN CALCIUM) 1 by mouth once daily  #90 x 3   Entered and Authorized by:   Corwin Levins MD   Signed by:   Corwin Levins MD on 03/18/2010   Method used:   Print then Give to Patient   RxID:   [id number] AZOR 10-40 MG  TABS (AMLODIPINE-OLMESARTAN) 1 by mouth once daily  #90 x 3   Entered and Authorized by:   Corwin Levins MD   Signed by:   Corwin Levins MD on 03/18/2010   Method used:   Print then Give to Patient   RxID:   [id number] NEXIUM 40 MG  CPDR (ESOMEPRAZOLE MAGNESIUM) 1 by mouth once daily  #90 x 3   Entered and Authorized by:   Corwin Levins MD   Signed by:   Corwin Levins MD on 03/18/2010   Method used:   Print then Give to Patient   RxID:   [id number] FOLIC ACID 1MG  1 by mouth once daily  #90 x 3   Entered and Authorized by:   Corwin Levins MD   Signed by:   Corwin Levins MD on 03/18/2010   Method used:   Print then Give to Patient   RxID:   [id number]

## 2010-09-24 NOTE — Progress Notes (Signed)
Summary: Call Report  Phone Note Other Incoming   Caller: [facility] Summary of Call: [facility] Triage Call Report Triage Record Num: [id number] Operator: Elita Boone Patient Name: Grace Baxter Call Date & Time: 05/03/2010 7:07:31PM Patient Phone: [phone] PCP: Dewayne Shorter Patient Gender: Female PCP Fax : [phone] Patient DOB: 1945/11/23 Practice Name: [facility] Reason for Call: Pt is calling for compazine from a headache. Pt reports that she takes Imitrex for headaches but has not taken it because she wants to try the compazine. Pt reports that she has headaches often and that the ED doctor told her the compazine would work better for her headach. Pt instructed to talk with her provider about medication changes. Protocol(s) Used: Medication Question Calls, No Triage (Adults) Recommended Outcome per Protocol: Provide Information or Advice Only Reason for Outcome: Caller has medication question only and triager answers question Care Advice:  ~ 09/ Initial call taken by: Margaret Pyle, CMA,  May 06, 2010 8:00 AM  Follow-up for Phone Call        noted Follow-up by: Corwin Levins MD,  May 06, 2010 8:35 AM

## 2010-11-06 ENCOUNTER — Telehealth: Payer: Self-pay | Admitting: Internal Medicine

## 2010-11-12 NOTE — Progress Notes (Signed)
Summary: Med. substitution  Phone Note From Pharmacy Call back at fax#[phone]   Caller: [facility] Summary of Call: Pharmacy fax informed that Cafergot 1/100mg  are temporarily unavailable from mfg. Generic Ergotamine-caffeine 1/100 also is unavailable. Do you want to substitute with another drug? [facility] fax#[phone] Initial call taken by: Robin Ewing CMA Duncan Dull),  November 06, 2010 9:04 AM  Follow-up for Phone Call        ok to hold for now until med is in stock Follow-up by: Corwin Levins MD,  November 06, 2010 9:21 AM  Additional Follow-up for Phone Call Additional follow up Details #1::        Called and informed pharmacy of MD's response. Additional Follow-up by: Robin Ewing CMA (AAMA),  November 06, 2010 10:12 AM
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
## 2011-01-07 NOTE — Procedures (Signed)
NAME:  Grace Baxter, Grace Baxter               ACCOUNT NO.:  [id number]   MEDICAL RECORD NO.:  [id number]          PATIENT TYPE:  OUT   LOCATION:  SLEEP CENTER                 FACILITY:  [facility]   PHYSICIAN:  Oretha Milch, MD      DATE OF BIRTH:  1946-04-13   DATE OF STUDY:  04/21/2007                            NOCTURNAL POLYSOMNOGRAM   REFERRING PHYSICIAN:   INDICATION FOR STUDY:  Grace Baxter is a 65 year old with insomnia (on  Ambien and Valium) recent weight gain of 50 pounds, loud snoring, and  reported witnessed apnea.  Weight 195 pounds, height 5 feet 2 inches.   EPWORTH SLEEPINESS SCORE:  4   MEDICATIONS:  1. Ambien 10 mg was taken at 10:50 p.m.  2. Diazepam 10 mg was taken at 10:50 p.m.   Medications at home include:  1. Evista 40 mg.  2. Sular 20 mg.  3. Diovan 360/25.  4. Crestor 40 mg.  5. Prozac 60 mg.  6. __________ .  7. Folic acid.  8. Nexium.   SLEEP ARCHITECTURE:  Nocturnal polysomnogram was obtained with a sleep technologist in  attendance. EEG, EOG, EMG, respiratory parameters, limb movements, and  EKG were recorded.  Sleep stages; arousals and respiratory data were  scored according to criteria from the American Academy of Sleep  Medicine.   Lights out was 10:57 p.m.  She had prolonged sleep latency of 275.5  minutes (4.6 hours) and sleep onset was only at 3:33 a.m.  Sleep period  time was 146.5 minutes with a total sleep time of 123 minutes leading to  a sleep maintenance efficiency of 84%, but an overall sleep efficiency  of only 29%.  Wake during sleep was 28 minutes.   Sleep stages as a percentage of total sleep time was stage I 14% and  stage II 86%.  No slow wave or REM sleep was recorded.   Arousal data; she had a total of 15 arousals of which 12 were associated  with respiratory events leading to an arousal index of 7.3 per hour.   Body position data; she spent 91 minutes in the supine position  achieving about 41.9 minutes of sleep.   RESPIRATORY  DATA:  There were a total of three obstructive apneas and 10  hypopneas recorded.  The apnea/hypopnea index was 6.3 events per hour.  The longest hypopnea was 72.3 seconds and the longest obstructive apnea  was 25.9 seconds.   OXYGEN DATA:  The lowest oxygen saturation during sleep was 88% with the  respiratory event during stage I sleep.  She spent about 5.8 minutes  with a saturation less than 90%.   CARDIAC DATA:  The lowest heart rate was 43 beats per minute during non-  REM sleep and the highest heart rate of 74 beats per minute during non-  REM sleep.   MOVEMENT-PARASOMNIA:  She had a total of 88 limb movements with a limb  movement index of 42.9 per hour.  None of these movements were  associated with arousal, however.   DISCUSSION:  The prolonged sleep latency in spite of taking  benzodiazepine was consistent with her history of sleep onset insomnia.  I do note that her sleep efficiency was good after she did fall asleep  at 3:30 a.m.  Few respiratory events were noted.  No REM or slow wave  sleep was noted.  The increased limb movements were not associated with  arousal.   FINAL IMPRESSION:  1. Mild obstructive sleep apnea.  2. Psychophysiologic insomnia.  3. Periodic limb movements during sleep.  4. Poor sleep hygiene.   RECOMMENDATIONS:  1. Treatment options for this mild degree of sleep apnea include      weight loss, CPAP therapy, oral appliances, and surgery.  These      should be discussed with the patient.  2. The prolonged sleep latency clearly suggests sleep onset insomnia      which was compounded by the first night effect.  She may be a      candidate for cognitive behavioral therapy since I do note that her      sleep maintenance efficiency was not bad once she actually did fall      asleep.  3. Rules of sleep hygiene should be discussed.  4. Check serum iron levels and medications for cause of limb      movements.      Oretha Milch, MD   Electronically Signed     RVA/MEDQ  D:  04/30/2007 12:31:09  T:  04/30/2007 13:01:51  Job:  [id number]   cc:   Corwin Levins, MD  [address]  [place]  [place] [address]

## 2011-04-17 ENCOUNTER — Encounter: Payer: Self-pay | Admitting: Internal Medicine

## 2011-04-22 ENCOUNTER — Encounter: Payer: Self-pay | Admitting: Internal Medicine

## 2011-05-26 ENCOUNTER — Encounter: Payer: Self-pay | Admitting: Internal Medicine

## 2011-05-26 DIAGNOSIS — Z0000 Encounter for general adult medical examination without abnormal findings: Secondary | ICD-10-CM

## 2011-05-26 DIAGNOSIS — R7302 Impaired glucose tolerance (oral): Secondary | ICD-10-CM

## 2011-05-26 HISTORY — DX: Impaired glucose tolerance (oral): R73.02

## 2011-05-26 HISTORY — DX: Encounter for general adult medical examination without abnormal findings: Z00.00

## 2011-05-28 ENCOUNTER — Telehealth: Payer: Self-pay

## 2011-05-28 NOTE — Telephone Encounter (Signed)
Fluvirin Site Left IM Manufacturer [employer] Date administered 05/26/2011 Lot Number [id number] Expiration date 01/22/2012 [facility] [address] Claris Gower [phone]

## 2011-05-29 ENCOUNTER — Other Ambulatory Visit (INDEPENDENT_AMBULATORY_CARE_PROVIDER_SITE_OTHER): Payer: Medicare Other

## 2011-05-29 ENCOUNTER — Encounter: Payer: Self-pay | Admitting: Internal Medicine

## 2011-05-29 ENCOUNTER — Ambulatory Visit (INDEPENDENT_AMBULATORY_CARE_PROVIDER_SITE_OTHER): Payer: Medicare Other | Admitting: Internal Medicine

## 2011-05-29 VITALS — BP 112/80 | HR 69 | Temp 97.4°F | Ht 62.0 in | Wt 184.1 lb

## 2011-05-29 DIAGNOSIS — F4310 Post-traumatic stress disorder, unspecified: Secondary | ICD-10-CM | POA: Insufficient documentation

## 2011-05-29 DIAGNOSIS — R7309 Other abnormal glucose: Secondary | ICD-10-CM

## 2011-05-29 DIAGNOSIS — F411 Generalized anxiety disorder: Secondary | ICD-10-CM

## 2011-05-29 DIAGNOSIS — E785 Hyperlipidemia, unspecified: Secondary | ICD-10-CM

## 2011-05-29 DIAGNOSIS — R7302 Impaired glucose tolerance (oral): Secondary | ICD-10-CM

## 2011-05-29 DIAGNOSIS — R5383 Other fatigue: Secondary | ICD-10-CM

## 2011-05-29 DIAGNOSIS — R5381 Other malaise: Secondary | ICD-10-CM

## 2011-05-29 DIAGNOSIS — I10 Essential (primary) hypertension: Secondary | ICD-10-CM | POA: Insufficient documentation

## 2011-05-29 LAB — HEPATIC FUNCTION PANEL
AST: 63 U/L — ABNORMAL HIGH (ref 0–37)
Bilirubin, Direct: 0.1 mg/dL (ref 0.0–0.3)
Total Bilirubin: 0.6 mg/dL (ref 0.3–1.2)

## 2011-05-29 LAB — BASIC METABOLIC PANEL
BUN: 13 mg/dL (ref 6–23)
GFR: 94.99 mL/min (ref >60.00)
Potassium: 4.4 mEq/L (ref 3.5–5.1)
Sodium: 141 mEq/L (ref 135–145)

## 2011-05-29 LAB — URINALYSIS, ROUTINE W REFLEX MICROSCOPIC
Bilirubin Urine: NEGATIVE
Ketones, ur: NEGATIVE
Total Protein, Urine: NEGATIVE
pH: 6 (ref 5.0–8.0)

## 2011-05-29 LAB — CBC WITH DIFFERENTIAL/PLATELET
Basophils Relative: 0.7 % (ref 0.0–3.0)
Eosinophils Relative: 3 % (ref 0.0–5.0)
HCT: 42.4 % (ref 36.0–46.0)
Lymphs Abs: 3 10*3/uL (ref 0.7–4.0)
MCV: 89.2 fl (ref 78.0–100.0)
Monocytes Absolute: 0.5 10*3/uL (ref 0.1–1.0)
Platelets: 286 10*3/uL (ref 150.0–400.0)
WBC: 6.4 10*3/uL (ref 4.5–10.5)

## 2011-05-29 LAB — LIPID PANEL
Cholesterol: 169 mg/dL (ref 0–200)
Triglycerides: 177 mg/dL — ABNORMAL HIGH (ref 0.0–149.0)

## 2011-05-29 MED ORDER — AMLODIPINE-OLMESARTAN 10-40 MG PO TABS: 1.0000 | ORAL_TABLET | Freq: Every day | ORAL | Status: AC

## 2011-05-29 MED ORDER — DIAZEPAM 10 MG PO TABS: ORAL_TABLET | ORAL | Status: DC

## 2011-05-29 MED ORDER — ESOMEPRAZOLE MAGNESIUM 40 MG PO CPDR: 40.0000 mg | DELAYED_RELEASE_CAPSULE | Freq: Every day | ORAL | Status: AC

## 2011-05-29 MED ORDER — ALENDRONATE SODIUM 70 MG PO TABS: 70.0000 mg | ORAL_TABLET | ORAL | Status: AC

## 2011-05-29 MED ORDER — AZITHROMYCIN 250 MG PO TABS: ORAL_TABLET | ORAL | Status: AC

## 2011-05-29 MED ORDER — SUMATRIPTAN SUCCINATE 50 MG PO TABS: 50.0000 mg | ORAL_TABLET | ORAL | Status: DC | PRN

## 2011-05-29 MED ORDER — FOLIC ACID 1 MG PO TABS: 1.0000 mg | ORAL_TABLET | Freq: Every day | ORAL | Status: AC

## 2011-05-29 MED ORDER — FUROSEMIDE 40 MG PO TABS: 40.0000 mg | ORAL_TABLET | Freq: Every day | ORAL | Status: AC

## 2011-05-29 MED ORDER — ZOLPIDEM TARTRATE 10 MG PO TABS: 10.0000 mg | ORAL_TABLET | Freq: Every evening | ORAL | Status: DC | PRN

## 2011-05-29 MED ORDER — ROSUVASTATIN CALCIUM 20 MG PO TABS: 20.0000 mg | ORAL_TABLET | Freq: Every day | ORAL | Status: AC

## 2011-05-29 MED ORDER — FLUOXETINE HCL 40 MG PO CAPS: 40.0000 mg | ORAL_CAPSULE | Freq: Every day | ORAL | Status: AC

## 2011-05-29 NOTE — Assessment & Plan Note (Signed)
stable overall by hx and exam, most recent data reviewed with pt, and pt to continue medical treatment as before  Lab Results  Component Value Date   LDLCALC 104* 03/20/2008

## 2011-05-29 NOTE — Assessment & Plan Note (Signed)
Etiology unclear, Exam otherwise benign, to check labs as documented, follow with expectant management  

## 2011-05-29 NOTE — Assessment & Plan Note (Addendum)
ECG reviewed as per emr, stable overall by hx and exam, most recent data reviewed with pt, and pt to continue medical treatment as before  BP Readings from Last 3 Encounters:  05/29/11 112/80  03/18/10 108/78  03/13/09 132/84

## 2011-05-29 NOTE — Assessment & Plan Note (Signed)
asympt   - for a1c today  
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
## 2011-05-29 NOTE — Assessment & Plan Note (Deleted)

## 2011-05-29 NOTE — Assessment & Plan Note (Signed)
Mild to mod uncontrolled, declines pscyhiatry, for prozac 20 qd  Re-start,  to f/u any worsening symptoms or concerns

## 2011-05-29 NOTE — Patient Instructions (Signed)
Continue all other medications as before Please go to LAB in the Basement for the blood and/or urine tests to be done today Please call the phone number [phone] (the PhoneTree System) for results of testing in 2-3 days;  When calling, simply dial the number, and when prompted enter the MRN number above (the Medical Record Number) and the # key, then the message should start. You are given all hardcopy refills of your medications today Please return in 6 months, or sooner if needed

## 2011-05-30 LAB — HEMOGLOBIN A1C: Hgb A1c MFr Bld: 6.1 % (ref 4.6–6.5)

## 2011-06-02 ENCOUNTER — Telehealth: Payer: Self-pay | Admitting: Internal Medicine

## 2011-06-02 MED ORDER — CEPHALEXIN 500 MG PO CAPS: 500.0000 mg | ORAL_CAPSULE | Freq: Four times a day (QID) | ORAL | Status: DC

## 2011-06-02 NOTE — Telephone Encounter (Signed)
I accidentally sent keflex course to [facility] for robin to contact pt- need to send cephalexin to her local pharmacy

## 2011-06-02 NOTE — Telephone Encounter (Signed)
Message copied by Corwin Levins on Mon Jun 02, 2011  2:30 PM ------      Message from: Scharlene Gloss B      Created: Mon Jun 02, 2011  1:50 PM       iniformed the patient, she is having pain and urgency. She would like the prescription to be sent to [facility] [address] Claris Gower [place] phone number is [phone]

## 2011-06-03 ENCOUNTER — Other Ambulatory Visit: Payer: Self-pay

## 2011-06-03 MED ORDER — CEPHALEXIN 500 MG PO CAPS: 500.0000 mg | ORAL_CAPSULE | Freq: Four times a day (QID) | ORAL | Status: AC

## 2011-06-03 NOTE — Telephone Encounter (Signed)
Called the patient sent antibiotic to pharmacy in [place] requested by patient.

## 2011-06-09 ENCOUNTER — Other Ambulatory Visit: Payer: Self-pay | Admitting: Internal Medicine

## 2011-06-09 MED ORDER — SUMATRIPTAN SUCCINATE 50 MG PO TABS: ORAL_TABLET | ORAL | Status: DC

## 2011-06-09 MED ORDER — SUMATRIPTAN SUCCINATE 50 MG PO TABS: ORAL_TABLET | ORAL | Status: AC

## 2011-06-09 NOTE — Telephone Encounter (Signed)
rx done in error - re-done today

## 2011-09-10 ENCOUNTER — Other Ambulatory Visit: Payer: Self-pay | Admitting: Internal Medicine

## 2011-11-27 ENCOUNTER — Ambulatory Visit: Payer: Medicare Other | Admitting: Internal Medicine

## 2011-12-09 ENCOUNTER — Other Ambulatory Visit: Payer: Self-pay | Admitting: Internal Medicine

## 2011-12-10 ENCOUNTER — Other Ambulatory Visit: Payer: Self-pay | Admitting: Internal Medicine

## 2011-12-10 NOTE — Telephone Encounter (Signed)
Faxed hardcopy to pharmacy. 

## 2011-12-10 NOTE — Telephone Encounter (Signed)
Done hardcopy to robin  

## 2012-03-29 ENCOUNTER — Encounter: Payer: Self-pay | Admitting: Internal Medicine

## 2012-12-16 ENCOUNTER — Encounter: Payer: Self-pay | Admitting: Internal Medicine

## 2022-02-19 ENCOUNTER — Other Ambulatory Visit (HOSPITAL_COMMUNITY): Payer: Self-pay | Admitting: Internal Medicine

## 2022-02-19 DIAGNOSIS — I509 Heart failure, unspecified: Secondary | ICD-10-CM

## 2022-02-26 ENCOUNTER — Other Ambulatory Visit (HOSPITAL_COMMUNITY): Payer: Medicare Other

## 2022-02-27 ENCOUNTER — Ambulatory Visit (HOSPITAL_COMMUNITY)
Admission: RE | Admit: 2022-02-27 | Discharge: 2022-02-27 | Disposition: A | Discharge status: Home / Self Care | Payer: Medicare Other | Source: Ambulatory Visit | Attending: Internal Medicine | Admitting: Internal Medicine

## 2022-02-27 DIAGNOSIS — G4730 Sleep apnea, unspecified: Secondary | ICD-10-CM | POA: Diagnosis not present

## 2022-02-27 DIAGNOSIS — I110 Hypertensive heart disease with heart failure: Secondary | ICD-10-CM | POA: Insufficient documentation

## 2022-02-27 DIAGNOSIS — I5040 Unspecified combined systolic (congestive) and diastolic (congestive) heart failure: Secondary | ICD-10-CM | POA: Insufficient documentation

## 2022-02-27 DIAGNOSIS — I082 Rheumatic disorders of both aortic and tricuspid valves: Secondary | ICD-10-CM | POA: Diagnosis not present

## 2022-02-27 DIAGNOSIS — I509 Heart failure, unspecified: Secondary | ICD-10-CM

## 2022-02-27 LAB — ECHOCARDIOGRAM COMPLETE
AR max vel: 1.19 cm2
AV Area VTI: 1.09 cm2
AV Area mean vel: 1.1 cm2
AV Mean grad: 34.6 mmHg
AV Peak grad: 58.6 mmHg
Ao pk vel: 3.83 m/s
Area-P 1/2: 3.91 cm2
Calc EF: 67.7 %
MV VTI: 2.82 cm2
S' Lateral: 2.9 cm
Single Plane A2C EF: 56.9 %
Single Plane A4C EF: 74.4 %

## 2022-02-27 NOTE — Progress Notes (Signed)
  Echocardiogram 2D Echocardiogram has been performed.  Janalyn Harder 02/27/2022, 10:47 AM

## 2022-09-25 DEATH — deceased
